# Patient Record
Sex: Female | Born: 1950 | Race: White | Hispanic: No | Marital: Married | Smoking: Never smoker
Health system: Southern US, Community
[De-identification: ages and names within clinical notes are randomized; demographics above are authoritative.]

---

## 1998-11-30 ENCOUNTER — Other Ambulatory Visit: Admission: RE | Admit: 1998-11-30 | Discharge: 1998-11-30 | Payer: Self-pay | Admitting: *Deleted

## 1999-12-03 ENCOUNTER — Other Ambulatory Visit: Admission: RE | Admit: 1999-12-03 | Discharge: 1999-12-03 | Payer: Self-pay | Admitting: *Deleted

## 2001-02-09 ENCOUNTER — Other Ambulatory Visit: Admission: RE | Admit: 2001-02-09 | Discharge: 2001-02-09 | Payer: Self-pay | Admitting: *Deleted

## 2001-07-30 ENCOUNTER — Ambulatory Visit (HOSPITAL_COMMUNITY): Admission: RE | Admit: 2001-07-30 | Discharge: 2001-07-30 | Payer: Self-pay | Admitting: *Deleted

## 2001-07-30 ENCOUNTER — Encounter: Payer: Self-pay | Admitting: Plastic Surgery

## 2002-02-22 ENCOUNTER — Other Ambulatory Visit: Admission: RE | Admit: 2002-02-22 | Discharge: 2002-02-22 | Payer: Self-pay | Admitting: *Deleted

## 2003-04-14 ENCOUNTER — Other Ambulatory Visit: Admission: RE | Admit: 2003-04-14 | Discharge: 2003-04-14 | Payer: Self-pay | Admitting: *Deleted

## 2004-04-17 ENCOUNTER — Other Ambulatory Visit: Admission: RE | Admit: 2004-04-17 | Discharge: 2004-04-17 | Payer: Self-pay | Admitting: *Deleted

## 2005-05-07 ENCOUNTER — Other Ambulatory Visit: Admission: RE | Admit: 2005-05-07 | Discharge: 2005-05-07 | Payer: Self-pay | Admitting: *Deleted

## 2006-06-24 ENCOUNTER — Other Ambulatory Visit: Admission: RE | Admit: 2006-06-24 | Discharge: 2006-06-24 | Payer: Self-pay | Admitting: *Deleted

## 2007-06-30 ENCOUNTER — Other Ambulatory Visit: Admission: RE | Admit: 2007-06-30 | Discharge: 2007-06-30 | Payer: Self-pay | Admitting: *Deleted

## 2008-07-14 ENCOUNTER — Other Ambulatory Visit: Admission: RE | Admit: 2008-07-14 | Discharge: 2008-07-14 | Payer: Self-pay | Admitting: Gynecology

## 2008-09-07 ENCOUNTER — Encounter: Admission: RE | Admit: 2008-09-07 | Discharge: 2008-09-07 | Payer: Self-pay | Admitting: Gynecology

## 2008-09-12 ENCOUNTER — Encounter: Admission: RE | Admit: 2008-09-12 | Discharge: 2008-09-12 | Payer: Self-pay | Admitting: Gynecology

## 2008-09-12 IMAGING — MG MM SCREENING W/IMPLANTS
8 series · 8 of 8 positions shown · non-contrast
Comparison: none

DG SCREENING W/IMPLANTS
Bilateral CC and MLO view(s) were taken.

DIGITAL SCREENING MAMMOGRAM W/IMPLANTS WITH CAD:
Silicone implants are present in a subglandular location.  Standard and modified compression views 
are obtained.
The breast tissue is herogeneously dense.  No dominant masses or malignant type calcifications are 
identified.  Compared with prior studies.

[R CC]
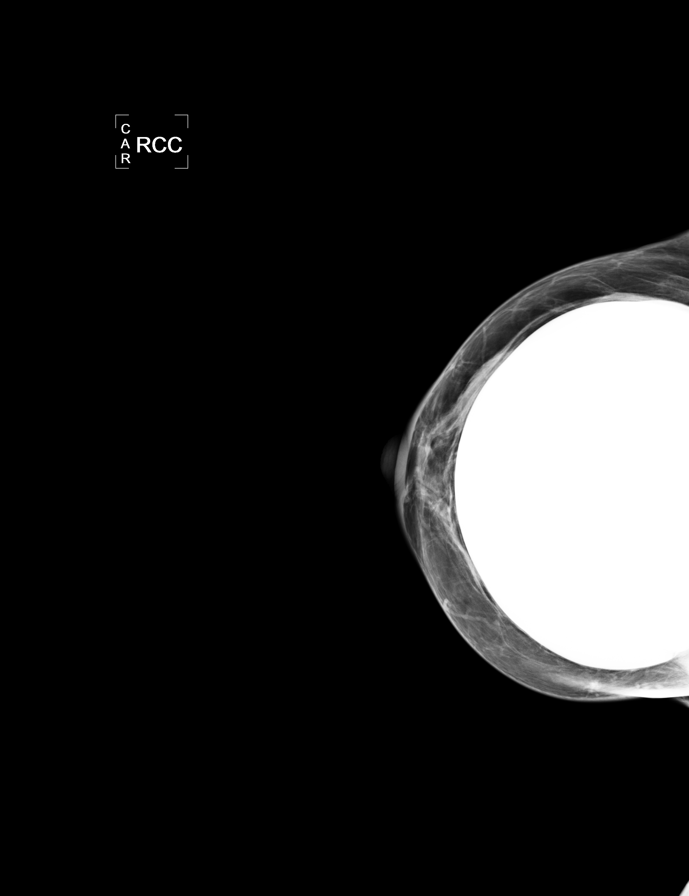

[L CC]
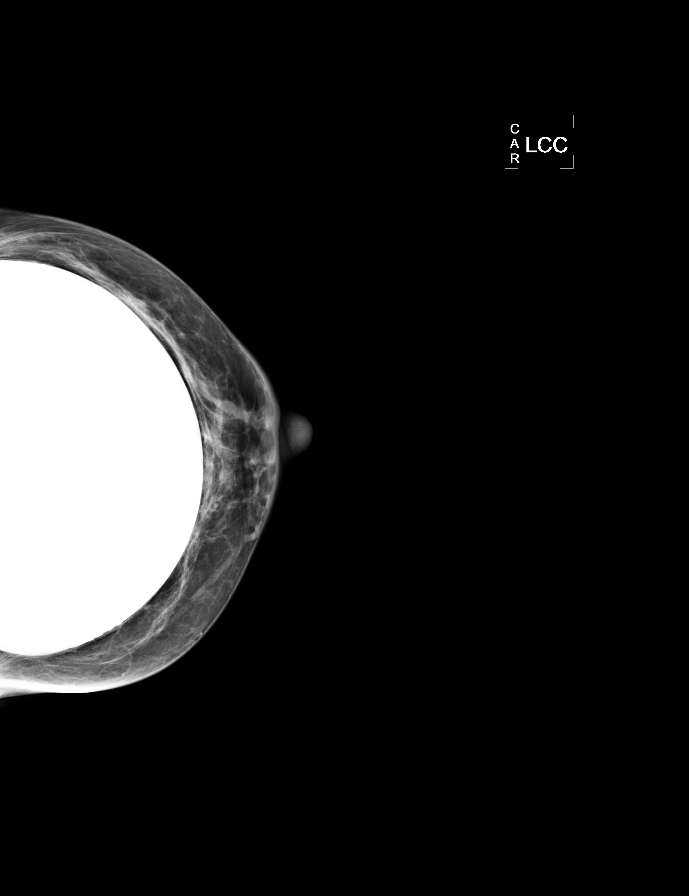

[L MLO]
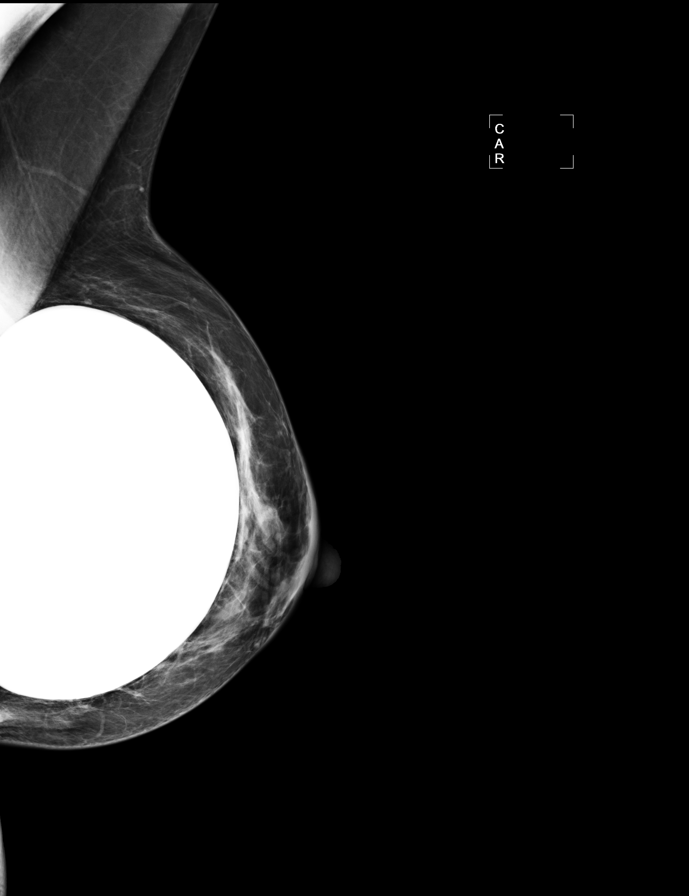

[R MLO]
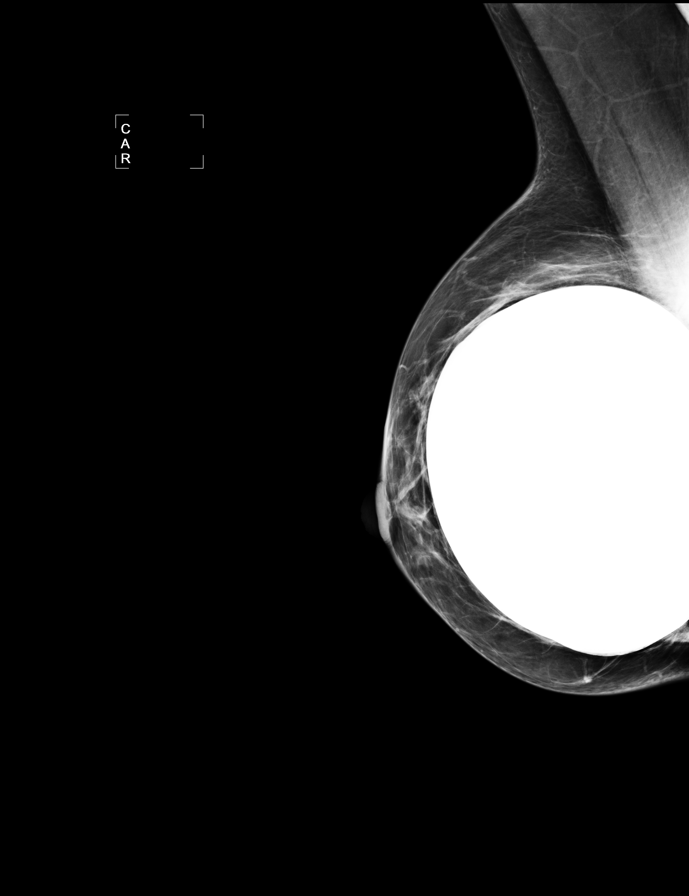

[R CCID]
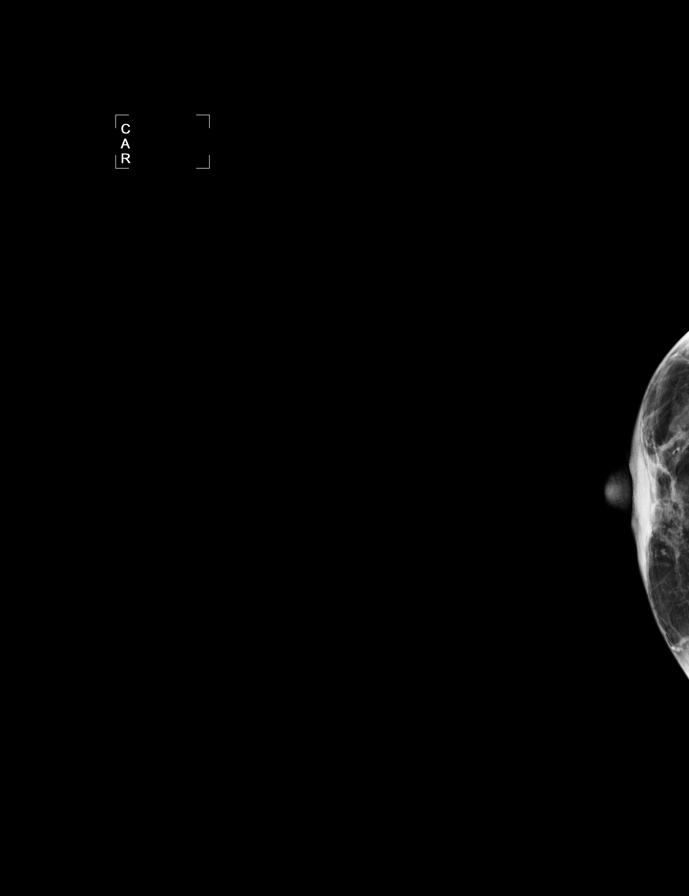

[L CCID]
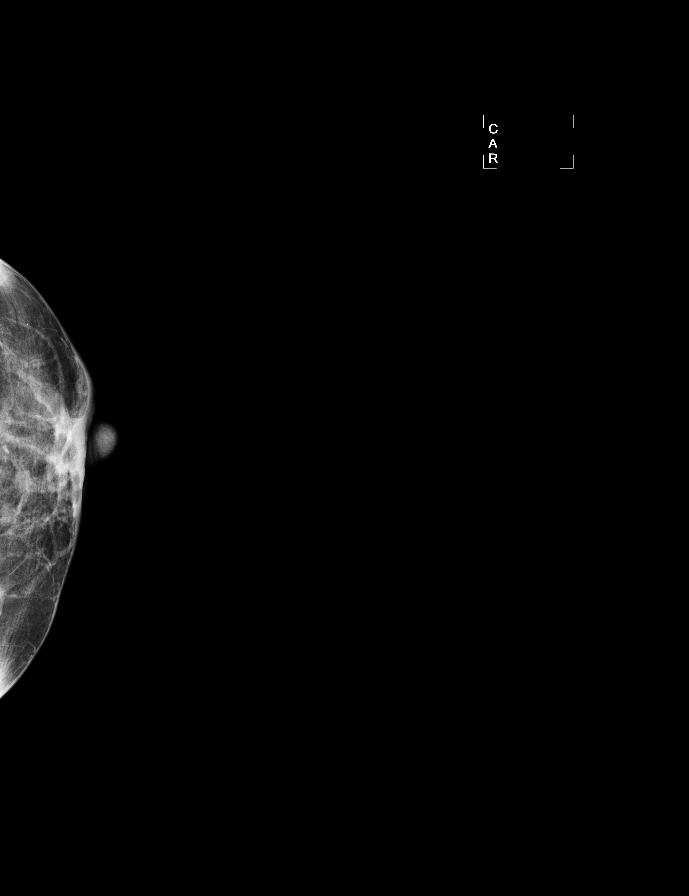

[L MLOID]
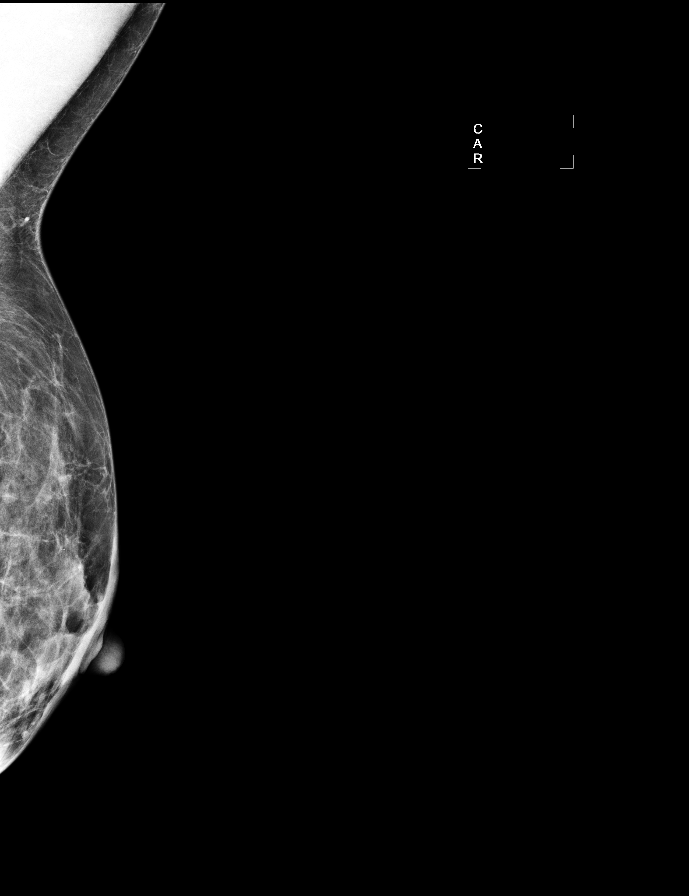

[R MLOID]
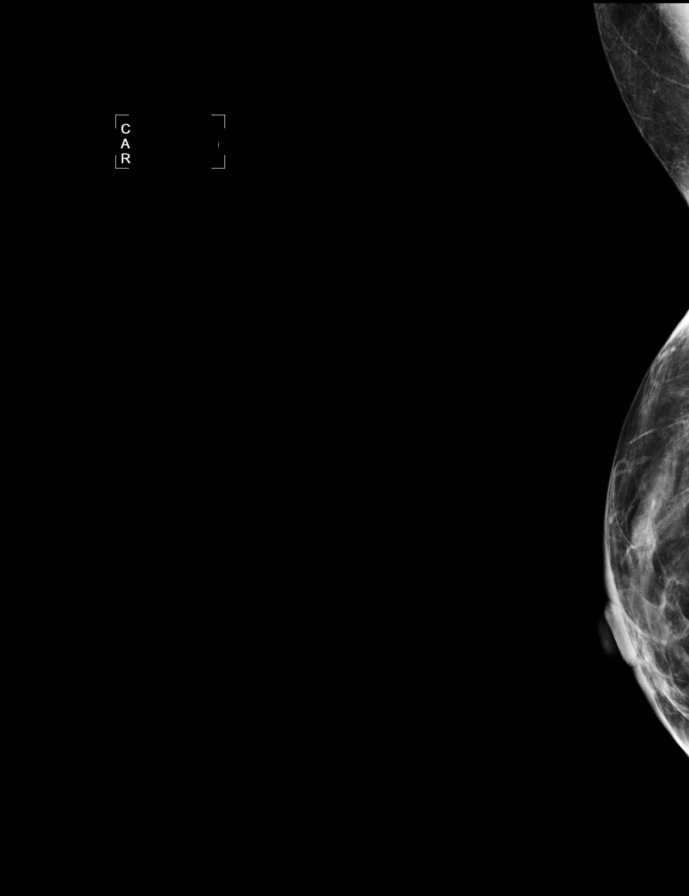

[8 of 8 positions shown; findings below may reference images not displayed]

IMPRESSION: No specific mammographic evidence of malignancy.  Next screening mammogram is recommended in one 
year.

ASSESSMENT: Negative - BI-RADS 1

Screening mammogram in 1 year.
ANALYZED BY COMPUTER AIDED DETECTION. , THIS PROCEDURE WAS A DIGITAL MAMMOGRAM.

## 2010-02-06 ENCOUNTER — Encounter: Admission: RE | Admit: 2010-02-06 | Discharge: 2010-02-06 | Payer: Self-pay | Admitting: Gynecology

## 2010-02-06 IMAGING — MG MM DIGITAL SCREENING W/ IMPLANTS
8 series · 8 of 8 positions shown · non-contrast
Comparison: none

DG SCREENING W/IMPLANTS
Bilateral CC and MLO view(s) were taken.

DIGITAL SCREENING MAMMOGRAM W/IMPLANTS WITH CAD:
Implants are present in a subglandular location.  Standard and modified compression views are 
obtained.
There are scattered fibroglandular densities.  No dominant masses or malignant type calcifications 
are identified.  Compared with prior studies.
Images were processed with CAD.

[R CC]
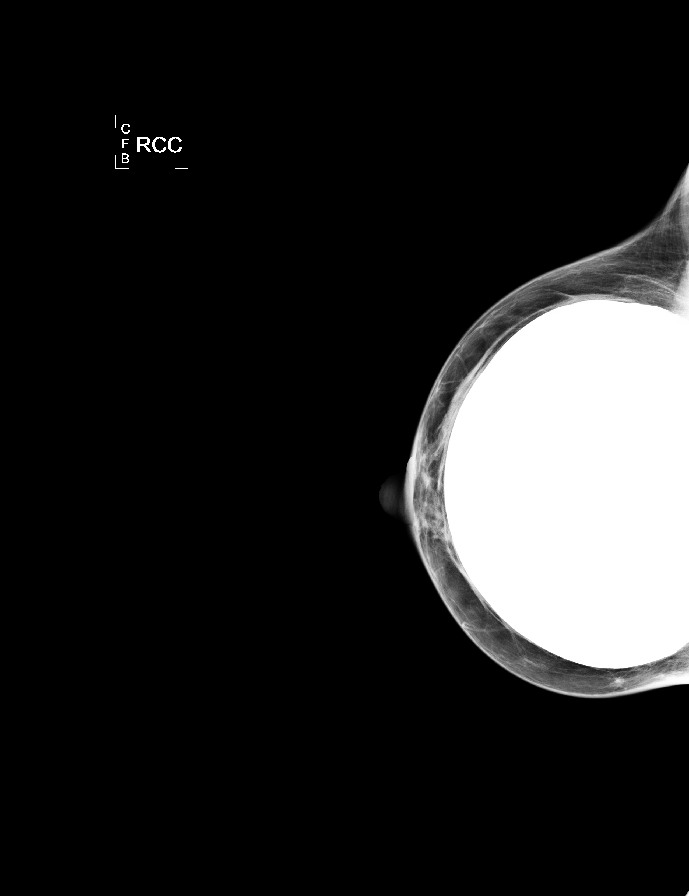

[L CC]
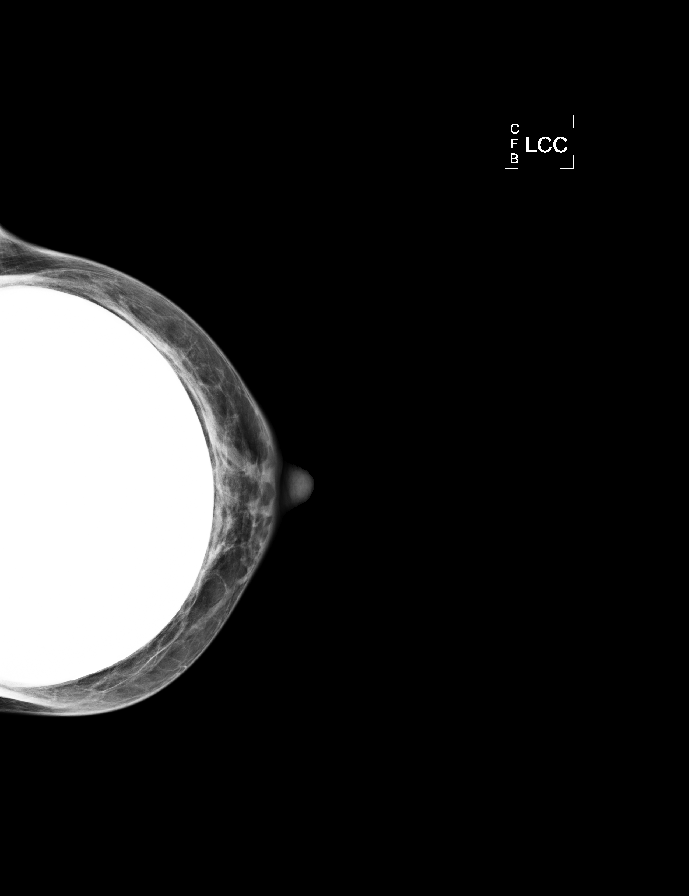

[L MLO]
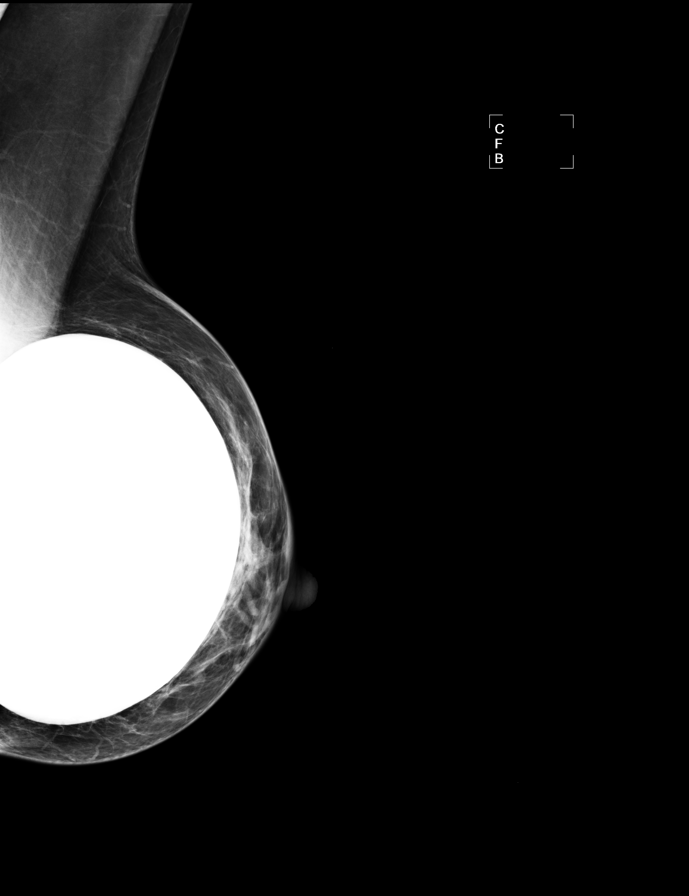

[R MLO]
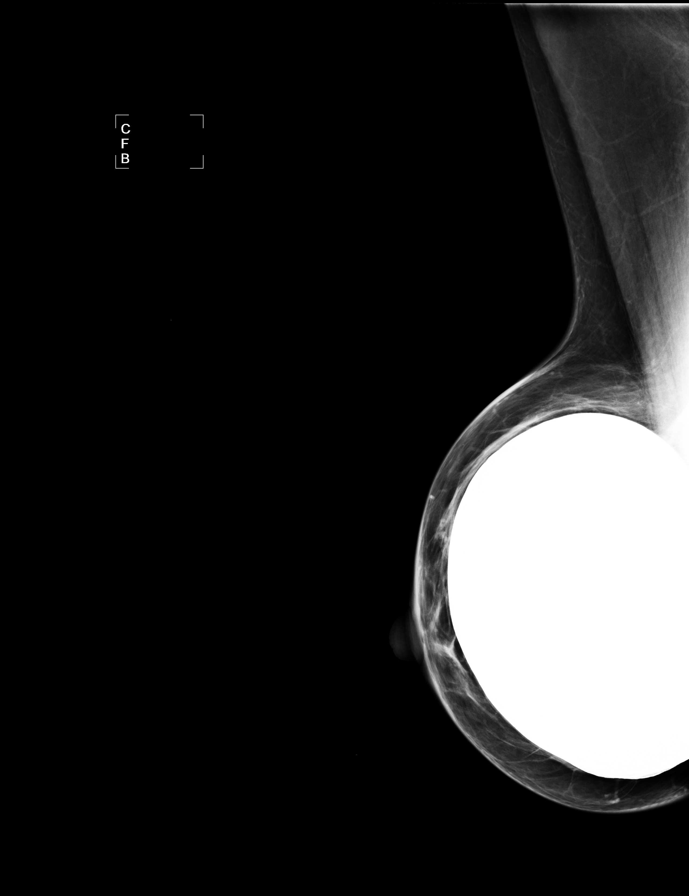

[R CCID]
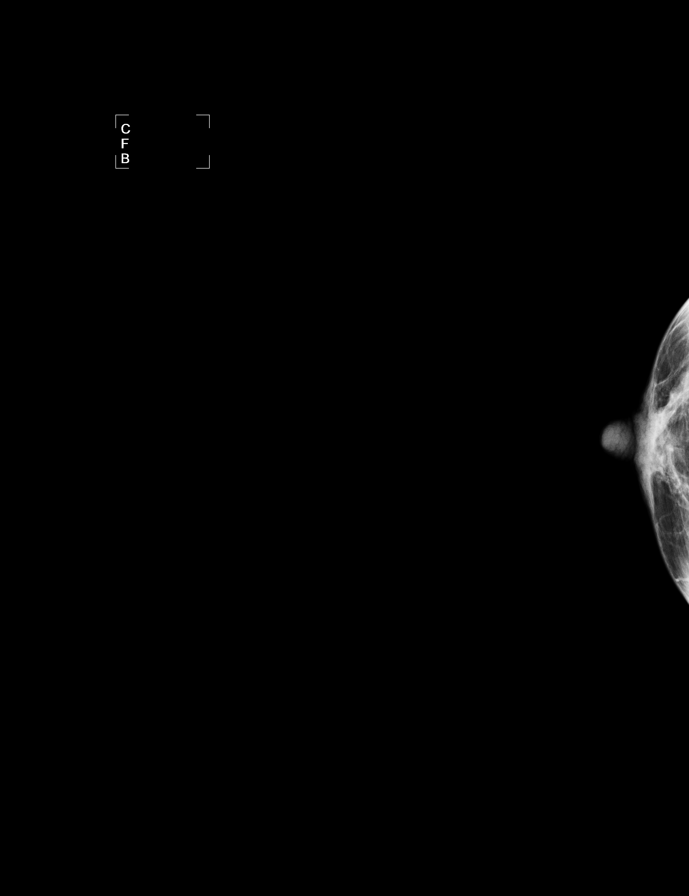

[L CCID]
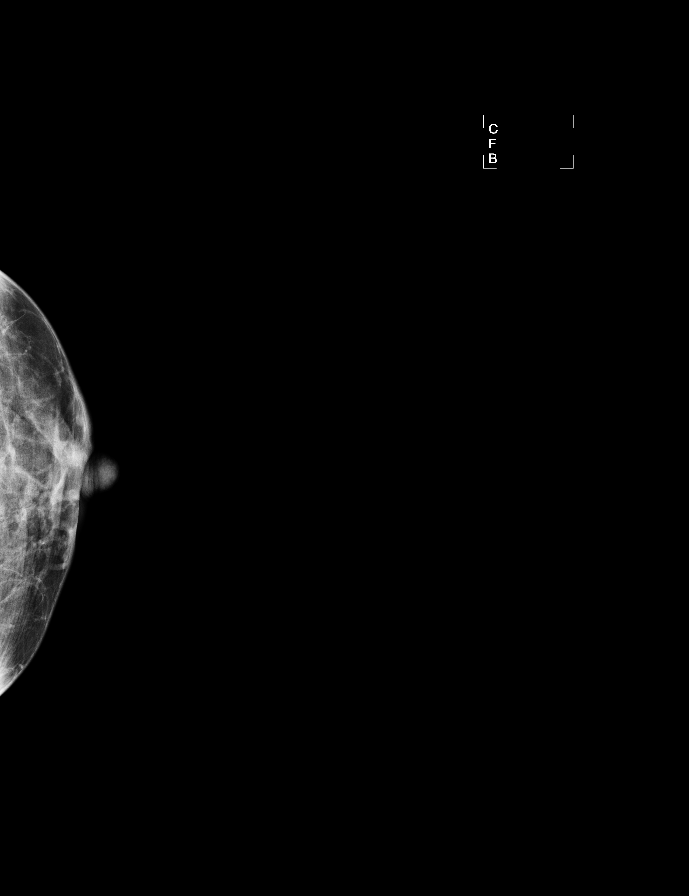

[L MLOID]
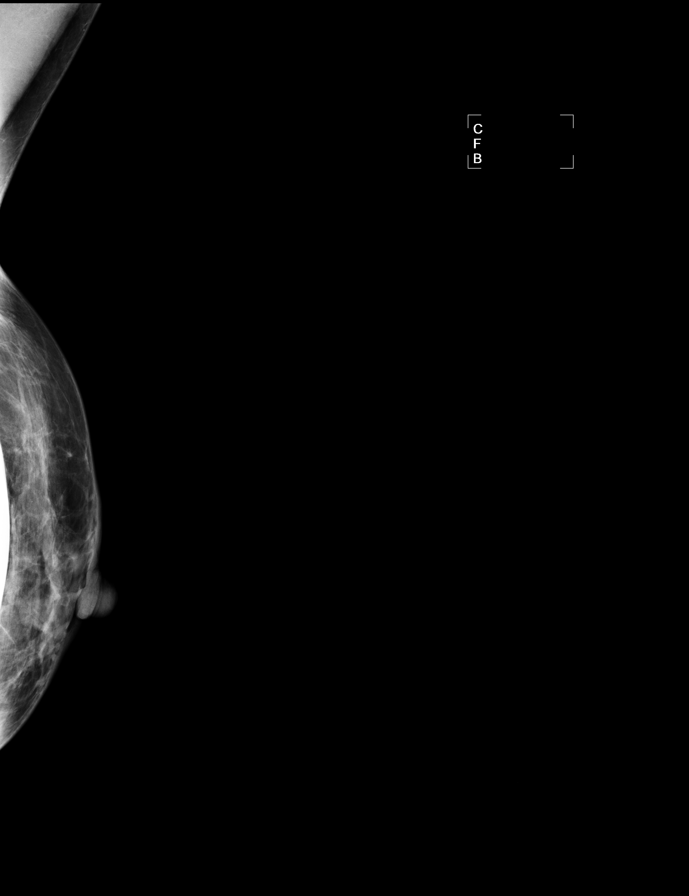

[R MLOID]
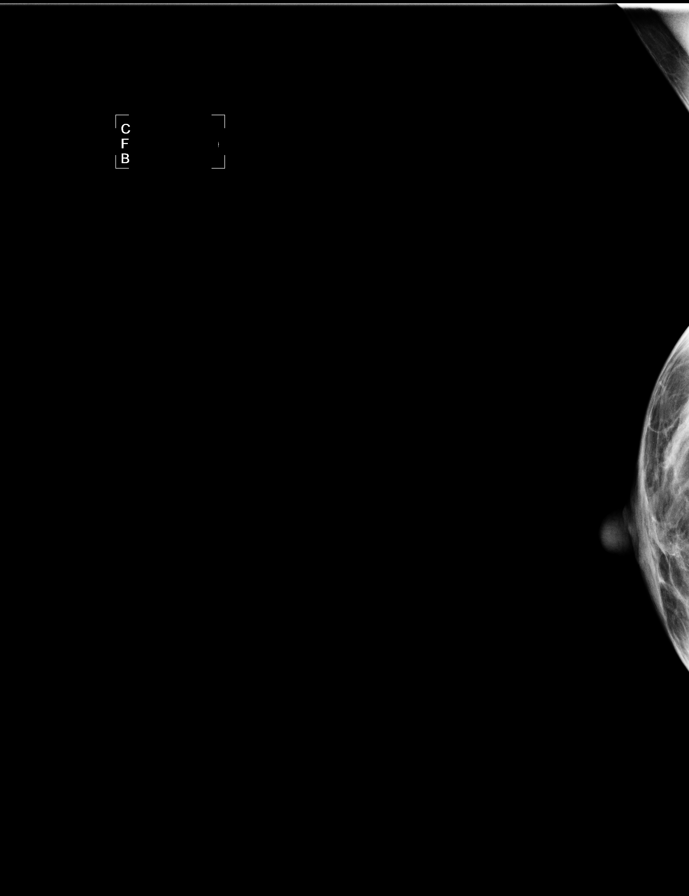

[8 of 8 positions shown; findings below may reference images not displayed]

IMPRESSION: No specific mammographic evidence of malignancy.  Next screening mammogram is recommended in one 
year.

A result letter of this screening mammogram will be mailed directly to the patient.

ASSESSMENT: Negative - BI-RADS 1

Screening mammogram in 1 year.
,

## 2016-11-06 ENCOUNTER — Ambulatory Visit (INDEPENDENT_AMBULATORY_CARE_PROVIDER_SITE_OTHER): Payer: Medicare Other | Admitting: Orthopaedic Surgery

## 2016-11-06 ENCOUNTER — Ambulatory Visit (INDEPENDENT_AMBULATORY_CARE_PROVIDER_SITE_OTHER): Payer: Medicare Other

## 2016-11-06 ENCOUNTER — Encounter (INDEPENDENT_AMBULATORY_CARE_PROVIDER_SITE_OTHER): Payer: Self-pay | Admitting: Orthopaedic Surgery

## 2016-11-06 VITALS — BP 119/78 | HR 73 | Ht 64.0 in | Wt 109.0 lb

## 2016-11-06 DIAGNOSIS — M79644 Pain in right finger(s): Secondary | ICD-10-CM

## 2016-11-06 DIAGNOSIS — M79641 Pain in right hand: Secondary | ICD-10-CM

## 2016-11-06 NOTE — Progress Notes (Signed)
   Office Visit Note   Patient: Emily Armstrong           Date of Birth: 11/01/1950           MRN: [id number] Visit Date: 11/06/2016              Requested by: No referring provider defined for this encounter. PCP: No primary care provider on file.   Assessment & Plan: Visit Diagnoses: Thickened callus on the palmar aspect of the right hand at the level of the metacarpal phalangeal joint. It could be a foreign body reaction but surely no evidence of infection or drainage   Plan:Monitor mass/callus for any changes otherwise see her back in a when necessary basis  Follow-Up Instructions: No Follow-up on file.   Orders:  No orders of the defined types were placed in this encounter.  No orders of the defined types were placed in this encounter.     Procedures: No procedures performed   Clinical Data: No additional findings.   Subjective: Chief Complaint  Patient presents with  . Right Ring Finger - Pain    Pt presents with Right Ring Finger pain for about 3 weeks, denies injury, plays tennis regularly and noticed the finger with a "knot" above the callus, and 2nd joint is swollen and painful.  Emily Armstrong plays "a lot of tenderness" and does yoga. She is concerned about the thickened mass or callus on the palmar aspect of the right ring finger near the metacarpal phalangeal joint. She denies any injury or trauma. She's had a little bit of swelling at the base of the ring finger and cannot apply her ring but it is not had any specific pain or numbness or tingling. The callus does not interfere with any of her activities  Review of Systems   Objective: Vital Signs: BP 119/78   Pulse 73   Ht 5\' 4"  (1.626 m)   Wt 109 lb (49.4 kg)   BMI 18.71 kg/m   Physical Exam  Ortho Exam examination of the right ring finger reveals a callus on the palmar aspect of the ring finger near the metacarpal phalangeal joint. Very minimal swelling along the base of the proximal phalanx but no  numbness or tingling. Full range of motion of the DIP PIP and MP joints. No triggering or localized pain. I wonder if this is just a callus or possibly a foreign body reaction. Nothing on the x-ray. No specialty comments available.  Imaging: No results found.   PMFS History: There are no active problems to display for this patient.  History reviewed. No pertinent past medical history.  History reviewed. No pertinent family history.  History reviewed. No pertinent surgical history. Social History   Occupational History  . Not on file.   Social History Main Topics  . Smoking status: Never Smoker  . Smokeless tobacco: Never Used  . Alcohol use 0.6 oz/week    1 Cans of beer per week  . Drug use: No  . Sexual activity: Not on file

## 2018-12-22 ENCOUNTER — Other Ambulatory Visit: Payer: Self-pay

## 2018-12-22 ENCOUNTER — Encounter (INDEPENDENT_AMBULATORY_CARE_PROVIDER_SITE_OTHER): Payer: Self-pay

## 2018-12-22 ENCOUNTER — Ambulatory Visit (INDEPENDENT_AMBULATORY_CARE_PROVIDER_SITE_OTHER): Payer: Self-pay

## 2018-12-22 ENCOUNTER — Ambulatory Visit (INDEPENDENT_AMBULATORY_CARE_PROVIDER_SITE_OTHER): Payer: Medicare Other | Admitting: Orthopaedic Surgery

## 2018-12-22 ENCOUNTER — Encounter (INDEPENDENT_AMBULATORY_CARE_PROVIDER_SITE_OTHER): Payer: Self-pay | Admitting: Orthopaedic Surgery

## 2018-12-22 VITALS — BP 120/79 | HR 63 | Ht 64.0 in | Wt 108.0 lb

## 2018-12-22 DIAGNOSIS — M25561 Pain in right knee: Secondary | ICD-10-CM | POA: Diagnosis not present

## 2018-12-22 DIAGNOSIS — M170 Bilateral primary osteoarthritis of knee: Secondary | ICD-10-CM | POA: Diagnosis not present

## 2018-12-22 MED ORDER — BUPIVACAINE HCL 0.5 % IJ SOLN
2.0000 mL | INTRAMUSCULAR | Status: AC | PRN
  Administered 2018-12-22: 2 mL via INTRA_ARTICULAR

## 2018-12-22 MED ORDER — LIDOCAINE HCL 1 % IJ SOLN
2.0000 mL | INTRAMUSCULAR | Status: AC | PRN
Start: 2018-12-22 — End: 2018-12-22
  Administered 2018-12-22: 2 mL

## 2018-12-22 MED ORDER — METHYLPREDNISOLONE ACETATE 40 MG/ML IJ SUSP
80.0000 mg | INTRAMUSCULAR | Status: AC | PRN
Start: 2018-12-22 — End: 2018-12-22
  Administered 2018-12-22: 80 mg via INTRA_ARTICULAR

## 2018-12-22 NOTE — Progress Notes (Signed)
Office Visit Note   Patient: Emily Armstrong           Date of Birth: 03-22-1951           MRN: [id number] Visit Date: 12/22/2018              Requested by: No referring provider defined for this encounter. PCP: Patient, No Pcp Per   Assessment & Plan: Visit Diagnoses:  1. Acute pain of right knee   2. Bilateral primary osteoarthritis of knee     Plan: Osteoarthritis right knee.  Long discussion regarding diagnosis and treatment options occasions.  Will inject the knee with cortisone and monitor response  Follow-Up Instructions: Return if symptoms worsen or fail to improve.   Orders:  Orders Placed This Encounter  Procedures  . Large Joint Inj: R knee  . XR KNEE 3 VIEW RIGHT   No orders of the defined types were placed in this encounter.     Procedures: Large Joint Inj: R knee on 12/22/2018 3:11 PM Indications: pain and diagnostic evaluation Details: 25 G 1.5 in needle, anteromedial approach  Arthrogram: No  Medications: 2 mL lidocaine 1 %; 2 mL bupivacaine 0.5 %; 80 mg methylPREDNISolone acetate 40 MG/ML Procedure, treatment alternatives, risks and benefits explained, specific risks discussed. Consent was given by the patient. Immediately prior to procedure a time out was called to verify the correct patient, procedure, equipment, support staff and site/side marked as required. Patient was prepped and draped in the usual sterile fashion.       Clinical Data: No additional findings.   Subjective: Chief Complaint  Patient presents with  . Right Knee - Pain  Patient presents today for right knee pain X2 months. She said that it hurts on the medial and lateral sides. She is very active and exercises a lot. She notices more pain after running and playing tennis. She taking Tylenol occasionally.  No popping, catching, grinding, or weakness in her right leg.  Had cortisone injection left knee in the past with excellent relief of her pain  HPI  Review of Systems   Constitutional: Negative for fatigue.  HENT: Negative for ear pain.   Eyes: Negative for pain.  Respiratory: Negative for shortness of breath.   Cardiovascular: Negative for leg swelling.  Gastrointestinal: Negative for constipation and diarrhea.  Endocrine: Negative for cold intolerance and heat intolerance.  Genitourinary: Negative for difficulty urinating.  Musculoskeletal: Negative for joint swelling.  Skin: Negative for rash.  Allergic/Immunologic: Negative for food allergies.  Neurological: Negative for weakness.  Hematological: Does not bruise/bleed easily.  Psychiatric/Behavioral: Negative for sleep disturbance.     Objective: Vital Signs: BP 120/79   Pulse 63   Ht 5\' 4"  (1.626 m)   Wt 108 lb (49 kg)   BMI 18.54 kg/m   Physical Exam Constitutional:      Appearance: She is well-developed.  Eyes:     Pupils: Pupils are equal, round, and reactive to light.  Pulmonary:     Effort: Pulmonary effort is normal.  Skin:    General: Skin is warm and dry.  Neurological:     Mental Status: She is alert and oriented to person, place, and time.  Psychiatric:        Behavior: Behavior normal.     Ortho Exam left knee without increased heat or swelling.  No effusion.  No instability.  Some patellar crepitation and very mild pain with patella compression.  Some pain medially.  Skin intact.  Neurologically  intact.  Full extension and flexion over 105 degrees  Specialty Comments:  No specialty comments available.  Imaging: Xr Knee 3 View Right  Result Date: 12/22/2018 Films of the right knee were obtained in several projections there are degenerative changes in all 3 compartments but relatively mild laterally.  There is some irregularity along the femoral surface medially with narrowing of the joint space.  Alignment appears to be neutral.  Patellofemoral joint also has degenerative changes.  Films are consistent with mild to moderate osteoarthritis predominately of the  patellofemoral and medial compartments.  No ectopic calcification    PMFS History: Patient Active Problem List   Diagnosis Date Noted  . Acute pain of right knee 12/22/2018  . Bilateral primary osteoarthritis of knee 12/22/2018   History reviewed. No pertinent past medical history.  History reviewed. No pertinent family history.  History reviewed. No pertinent surgical history. Social History   Occupational History  . Not on file  Tobacco Use  . Smoking status: Never Smoker  . Smokeless tobacco: Never Used  Substance and Sexual Activity  . Alcohol use: Yes    Alcohol/week: 1.0 standard drinks    Types: 1 Cans of beer per week  . Drug use: No  . Sexual activity: Not on file

## 2023-02-27 DIAGNOSIS — L821 Other seborrheic keratosis: Secondary | ICD-10-CM | POA: Diagnosis not present

## 2023-02-27 DIAGNOSIS — D2339 Other benign neoplasm of skin of other parts of face: Secondary | ICD-10-CM | POA: Diagnosis not present

## 2023-02-27 DIAGNOSIS — C44519 Basal cell carcinoma of skin of other part of trunk: Secondary | ICD-10-CM | POA: Diagnosis not present

## 2023-07-23 ENCOUNTER — Ambulatory Visit (HOSPITAL_BASED_OUTPATIENT_CLINIC_OR_DEPARTMENT_OTHER): Payer: Medicare Other | Admitting: Family Medicine

## 2023-09-09 DIAGNOSIS — C44722 Squamous cell carcinoma of skin of right lower limb, including hip: Secondary | ICD-10-CM | POA: Diagnosis not present

## 2023-09-18 DIAGNOSIS — C44722 Squamous cell carcinoma of skin of right lower limb, including hip: Secondary | ICD-10-CM | POA: Diagnosis not present

## 2024-01-07 DIAGNOSIS — D1801 Hemangioma of skin and subcutaneous tissue: Secondary | ICD-10-CM | POA: Diagnosis not present

## 2024-01-07 DIAGNOSIS — D2372 Other benign neoplasm of skin of left lower limb, including hip: Secondary | ICD-10-CM | POA: Diagnosis not present

## 2024-01-07 DIAGNOSIS — L814 Other melanin hyperpigmentation: Secondary | ICD-10-CM | POA: Diagnosis not present

## 2024-01-07 DIAGNOSIS — L821 Other seborrheic keratosis: Secondary | ICD-10-CM | POA: Diagnosis not present

## 2024-01-07 DIAGNOSIS — D225 Melanocytic nevi of trunk: Secondary | ICD-10-CM | POA: Diagnosis not present

## 2024-01-07 DIAGNOSIS — Z85828 Personal history of other malignant neoplasm of skin: Secondary | ICD-10-CM | POA: Diagnosis not present

## 2024-01-07 DIAGNOSIS — L570 Actinic keratosis: Secondary | ICD-10-CM | POA: Diagnosis not present

## 2024-01-07 DIAGNOSIS — D2239 Melanocytic nevi of other parts of face: Secondary | ICD-10-CM | POA: Diagnosis not present

## 2024-01-07 DIAGNOSIS — D2261 Melanocytic nevi of right upper limb, including shoulder: Secondary | ICD-10-CM | POA: Diagnosis not present

## 2024-01-07 DIAGNOSIS — D224 Melanocytic nevi of scalp and neck: Secondary | ICD-10-CM | POA: Diagnosis not present

## 2024-01-15 DIAGNOSIS — Z1231 Encounter for screening mammogram for malignant neoplasm of breast: Secondary | ICD-10-CM | POA: Diagnosis not present

## 2024-01-15 DIAGNOSIS — Z1329 Encounter for screening for other suspected endocrine disorder: Secondary | ICD-10-CM | POA: Diagnosis not present

## 2024-01-15 DIAGNOSIS — Z131 Encounter for screening for diabetes mellitus: Secondary | ICD-10-CM | POA: Diagnosis not present

## 2024-01-15 DIAGNOSIS — Z13228 Encounter for screening for other metabolic disorders: Secondary | ICD-10-CM | POA: Diagnosis not present

## 2024-01-15 DIAGNOSIS — E785 Hyperlipidemia, unspecified: Secondary | ICD-10-CM | POA: Diagnosis not present

## 2024-01-15 DIAGNOSIS — Z6820 Body mass index (BMI) 20.0-20.9, adult: Secondary | ICD-10-CM | POA: Diagnosis not present

## 2024-05-12 DIAGNOSIS — C44212 Basal cell carcinoma of skin of right ear and external auricular canal: Secondary | ICD-10-CM | POA: Diagnosis not present

## 2024-05-12 DIAGNOSIS — D485 Neoplasm of uncertain behavior of skin: Secondary | ICD-10-CM | POA: Diagnosis not present

## 2024-05-12 DIAGNOSIS — L570 Actinic keratosis: Secondary | ICD-10-CM | POA: Diagnosis not present
